# Patient Record
Sex: Male | Born: 1970 | Race: Black or African American | Hispanic: No | Marital: Single
Health system: Southern US, Community
[De-identification: ages and names within clinical notes are randomized; demographics above are authoritative.]

## PROBLEM LIST (undated history)

## (undated) DIAGNOSIS — E119 Type 2 diabetes mellitus without complications: Secondary | ICD-10-CM

## (undated) DIAGNOSIS — I10 Essential (primary) hypertension: Secondary | ICD-10-CM

---

## 2019-09-25 ENCOUNTER — Emergency Department (HOSPITAL_COMMUNITY)
Admission: EM | Admit: 2019-09-25 | Discharge: 2019-09-26 | Disposition: A | Discharge status: Home / Self Care | Payer: 59 | Attending: Emergency Medicine | Admitting: Emergency Medicine

## 2019-09-25 ENCOUNTER — Encounter (HOSPITAL_COMMUNITY): Payer: Self-pay | Admitting: Emergency Medicine

## 2019-09-25 ENCOUNTER — Other Ambulatory Visit: Payer: Self-pay

## 2019-09-25 ENCOUNTER — Emergency Department (HOSPITAL_COMMUNITY): Payer: 59

## 2019-09-25 DIAGNOSIS — M542 Cervicalgia: Secondary | ICD-10-CM | POA: Insufficient documentation

## 2019-09-25 DIAGNOSIS — E119 Type 2 diabetes mellitus without complications: Secondary | ICD-10-CM | POA: Diagnosis not present

## 2019-09-25 DIAGNOSIS — T148XXA Other injury of unspecified body region, initial encounter: Secondary | ICD-10-CM

## 2019-09-25 DIAGNOSIS — I10 Essential (primary) hypertension: Secondary | ICD-10-CM | POA: Insufficient documentation

## 2019-09-25 DIAGNOSIS — Y92009 Unspecified place in unspecified non-institutional (private) residence as the place of occurrence of the external cause: Secondary | ICD-10-CM | POA: Insufficient documentation

## 2019-09-25 DIAGNOSIS — S301XXA Contusion of abdominal wall, initial encounter: Secondary | ICD-10-CM | POA: Insufficient documentation

## 2019-09-25 DIAGNOSIS — S3991XA Unspecified injury of abdomen, initial encounter: Secondary | ICD-10-CM | POA: Diagnosis present

## 2019-09-25 DIAGNOSIS — Z794 Long term (current) use of insulin: Secondary | ICD-10-CM | POA: Insufficient documentation

## 2019-09-25 DIAGNOSIS — R519 Headache, unspecified: Secondary | ICD-10-CM | POA: Diagnosis not present

## 2019-09-25 DIAGNOSIS — M25512 Pain in left shoulder: Secondary | ICD-10-CM | POA: Insufficient documentation

## 2019-09-25 HISTORY — DX: Essential (primary) hypertension: I10

## 2019-09-25 HISTORY — DX: Type 2 diabetes mellitus without complications: E11.9
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
## 2019-09-26 ENCOUNTER — Emergency Department (HOSPITAL_COMMUNITY): Payer: 59

## 2019-09-26 LAB — CBC WITH DIFFERENTIAL/PLATELET
Abs Immature Granulocytes: 0.02 10*3/uL (ref 0.00–0.07)
Basophils Absolute: 0 10*3/uL (ref 0.0–0.1)
Basophils Relative: 0 %
Eosinophils Absolute: 0 10*3/uL (ref 0.0–0.5)
Eosinophils Relative: 0 %
HCT: 49.9 % (ref 39.0–52.0)
Hemoglobin: 15.6 g/dL (ref 13.0–17.0)
Immature Granulocytes: 0 %
Lymphocytes Relative: 16 %
Lymphs Abs: 1.2 10*3/uL (ref 0.7–4.0)
MCH: 25.3 pg — ABNORMAL LOW (ref 26.0–34.0)
MCHC: 31.3 g/dL (ref 30.0–36.0)
MCV: 81 fL (ref 80.0–100.0)
Monocytes Absolute: 1 10*3/uL (ref 0.1–1.0)
Monocytes Relative: 14 %
Neutro Abs: 4.9 10*3/uL (ref 1.7–7.7)
Neutrophils Relative %: 70 %
Platelets: 192 10*3/uL (ref 150–400)
RBC: 6.16 MIL/uL — ABNORMAL HIGH (ref 4.22–5.81)
RDW: 14.7 % (ref 11.5–15.5)
WBC: 7.2 10*3/uL (ref 4.0–10.5)
nRBC: 0 % (ref 0.0–0.2)

## 2019-09-26 LAB — COMPREHENSIVE METABOLIC PANEL
ALT: 17 U/L (ref 0–44)
AST: 38 U/L (ref 15–41)
Albumin: 3.8 g/dL (ref 3.5–5.0)
Alkaline Phosphatase: 46 U/L (ref 38–126)
Anion gap: 10 (ref 5–15)
BUN: 13 mg/dL (ref 6–20)
CO2: 25 mmol/L (ref 22–32)
Calcium: 8.9 mg/dL (ref 8.9–10.3)
Chloride: 99 mmol/L (ref 98–111)
Creatinine, Ser: 1.12 mg/dL (ref 0.61–1.24)
GFR calc Af Amer: >60 mL/min (ref >60)
GFR calc non Af Amer: >60 mL/min (ref >60)
Glucose, Bld: 146 mg/dL — ABNORMAL HIGH (ref 70–99)
Potassium: 4 mmol/L (ref 3.5–5.1)
Sodium: 134 mmol/L — ABNORMAL LOW (ref 135–145)
Total Bilirubin: 1 mg/dL (ref 0.3–1.2)
Total Protein: 7.3 g/dL (ref 6.5–8.1)

## 2019-09-26 LAB — CBG MONITORING, ED
Glucose-Capillary: 163 mg/dL — ABNORMAL HIGH (ref 70–99)
Glucose-Capillary: 177 mg/dL — ABNORMAL HIGH (ref 70–99)

## 2019-09-26 MED ORDER — NAPROXEN 500 MG PO TABS: 500.0000 mg | ORAL_TABLET | Freq: Two times a day (BID) | ORAL | 0 refills | Status: AC | PRN

## 2019-09-26 MED ORDER — ACETAMINOPHEN 325 MG PO TABS
650.0000 mg | ORAL_TABLET | Freq: Once | ORAL | Status: AC
  Administered 2019-09-26: 650 mg via ORAL
  Filled 2019-09-26: qty 2

## 2019-09-26 MED ORDER — IOHEXOL 9 MG/ML PO SOLN
ORAL | Status: AC
  Administered 2019-09-26: 500 mL
  Filled 2019-09-26: qty 1000

## 2019-09-26 MED ORDER — ACETAMINOPHEN 500 MG PO TABS: 500.0000 mg | ORAL_TABLET | Freq: Four times a day (QID) | ORAL | 0 refills | Status: AC | PRN

## 2019-09-26 MED ORDER — CYCLOBENZAPRINE HCL 10 MG PO TABS: 10.0000 mg | ORAL_TABLET | Freq: Two times a day (BID) | ORAL | 0 refills | Status: AC | PRN

## 2019-09-26 MED ORDER — IOHEXOL 300 MG/ML  SOLN
100.0000 mL | Freq: Once | INTRAMUSCULAR | Status: AC | PRN
  Administered 2019-09-26: 100 mL via INTRAVENOUS

## 2019-09-26 NOTE — ED Notes (Signed)
Patient transported to CT 

## 2019-09-26 NOTE — Discharge Instructions (Addendum)
Please take the naproxen, as directed.  You were given a prescription for Flexeril which is a muscle relaxer.  You should not drive, work, consume alcohol, or operate machinery while taking this medication as it can make you very drowsy.    You will need to follow-up with your PCP regarding today's encounter.  Return to the ED or seek immediate medical attention should you experience any new or worsening symptoms.  1. Medications: Ibuprofen or Tylenol for pain 2. Treatment: Rest, can use ice on head. Recommend that you remain in a quiet, not simulating, dark environment. No TV, computer use, video games until headache is resolved completely. No contact sports until cleared by your pediatrician or PCP. 3. Follow Up: With primary care physician on Monday if headache persists.  Return to the emergency department if you become lethargic, begin vomiting, develop double vision, speech difficulty, problems walking, or other change in mental status.
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
## 2019-09-26 NOTE — ED Notes (Addendum)
Wife remains at bedside.

## 2021-05-26 DIAGNOSIS — I517 Cardiomegaly: Secondary | ICD-10-CM | POA: Diagnosis not present
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
